# Patient Record
Sex: Male | Born: 1980 | Race: Black or African American | Hispanic: No | Marital: Married | Smoking: Former smoker
Health system: Southern US, Community
[De-identification: ages and names within clinical notes are randomized; demographics above are authoritative.]

---

## 2013-10-02 ENCOUNTER — Ambulatory Visit (INDEPENDENT_AMBULATORY_CARE_PROVIDER_SITE_OTHER): Payer: BC Managed Care – PPO | Admitting: Podiatry

## 2013-10-02 ENCOUNTER — Encounter: Payer: Self-pay | Admitting: Podiatry

## 2013-10-02 ENCOUNTER — Ambulatory Visit (INDEPENDENT_AMBULATORY_CARE_PROVIDER_SITE_OTHER): Payer: BC Managed Care – PPO

## 2013-10-02 VITALS — BP 137/92 | HR 91 | Resp 15 | Ht 71.0 in | Wt 295.0 lb

## 2013-10-02 DIAGNOSIS — M79609 Pain in unspecified limb: Secondary | ICD-10-CM

## 2013-10-02 DIAGNOSIS — M722 Plantar fascial fibromatosis: Secondary | ICD-10-CM

## 2013-10-02 DIAGNOSIS — M79671 Pain in right foot: Secondary | ICD-10-CM

## 2013-10-02 MED ORDER — TRIAMCINOLONE ACETONIDE 10 MG/ML IJ SUSP
10.0000 mg | Freq: Once | INTRAMUSCULAR | Status: AC
  Administered 2013-10-02: 10 mg

## 2013-10-02 MED ORDER — DICLOFENAC SODIUM 75 MG PO TBEC: 75.0000 mg | DELAYED_RELEASE_TABLET | Freq: Two times a day (BID) | ORAL | Status: DC

## 2013-10-02 NOTE — Progress Notes (Signed)
   Subjective:    Patient ID: Charlott RakesMario Juste, male    DOB: 1980-10-30, 33 y.o.   MRN: [id number]  HPI Comments: Pt complains of pain and swelling in the right heel and inferior arch, during basketball playing and after.  Pt states he had 4 episodes over 3 weeks, ER visit prescribed Tylenol #3 for 3 days, soaks and ace wrap the right foot.     Review of Systems  All other systems reviewed and are negative.      Objective:   Physical Exam        Assessment & Plan:

## 2013-10-02 NOTE — Patient Instructions (Signed)

## 2013-10-03 NOTE — Progress Notes (Signed)
Subjective:     Patient ID: Caleb Cook, male   DOB: Sep 27, 1980, 33 y.o.   MRN: [id number]  HPI patient presents with painful right heel of approximate one-month duration. States it's worse with activity and at times it throbs and he has had trouble sleeping and he has not been able to do some of his normal type of athletic events he would like to do   Review of Systems  All other systems reviewed and are negative.      Objective:   Physical Exam  Nursing note and vitals reviewed. Constitutional: He is oriented to person, place, and time.  Cardiovascular: Intact distal pulses.   Musculoskeletal: Normal range of motion.  Neurological: He is oriented to person, place, and time.  Skin: Skin is warm.   neurovascular status intact with muscle strength adequate and range of motion of the subtalar and midtarsal joint within normal limits. Patient's found to have mild equinus condition and digits that are well-perfused and I noted upon palpation the plantar heel is very tender at the insertion of the plantar fascia into the calcaneus     Assessment:     Plantar fasciitis right insertional    Plan:     H&P and x-ray reviewed and today I injected the right plantar fascia 3 mg Kenalog 5 mg I can Marcaine mixture and applied fascially brace to the foot and placed on diclofenac 75 mg twice a day. Reappoint to recheck again in one week

## 2013-10-09 ENCOUNTER — Ambulatory Visit (INDEPENDENT_AMBULATORY_CARE_PROVIDER_SITE_OTHER): Payer: BC Managed Care – PPO | Admitting: Podiatry

## 2013-10-09 ENCOUNTER — Encounter: Payer: Self-pay | Admitting: Podiatry

## 2013-10-09 VITALS — BP 135/91 | HR 101 | Resp 16

## 2013-10-09 DIAGNOSIS — M722 Plantar fascial fibromatosis: Secondary | ICD-10-CM

## 2013-10-09 MED ORDER — TRIAMCINOLONE ACETONIDE 10 MG/ML IJ SUSP
10.0000 mg | Freq: Once | INTRAMUSCULAR | Status: AC
  Administered 2013-10-09: 10 mg

## 2013-10-09 NOTE — Progress Notes (Signed)
Subjective:     Patient ID: Charlott RakesMario Sider, male   DOB: 1980-04-05, 33 y.o.   MRN: [id number]  HPI patient states the right heel is doing pretty well but is painful at the end of the day and he is on his feet all day walking on cement floors   Review of Systems     Objective:   Physical Exam Neurovascular status intact with muscle strength adequate and inflammation pain in the right arch that is painful when pressed    Assessment:     Continue plantar fasciitis right with inflammation with improvement that has occurred    Plan:     Reinjected the plantar fascia 3 mg Kenalog 5 mg Xylocaine and advised on physical therapy and if symptoms were to continue we will consider orthotics therapy

## 2014-05-17 ENCOUNTER — Emergency Department (HOSPITAL_BASED_OUTPATIENT_CLINIC_OR_DEPARTMENT_OTHER)
Admission: EM | Admit: 2014-05-17 | Discharge: 2014-05-17 | Disposition: A | Discharge status: Home / Self Care | Payer: 59 | Attending: Emergency Medicine | Admitting: Emergency Medicine

## 2014-05-17 ENCOUNTER — Encounter (HOSPITAL_BASED_OUTPATIENT_CLINIC_OR_DEPARTMENT_OTHER): Payer: Self-pay

## 2014-05-17 ENCOUNTER — Emergency Department (HOSPITAL_BASED_OUTPATIENT_CLINIC_OR_DEPARTMENT_OTHER): Payer: 59

## 2014-05-17 DIAGNOSIS — X58XXXA Exposure to other specified factors, initial encounter: Secondary | ICD-10-CM | POA: Diagnosis not present

## 2014-05-17 DIAGNOSIS — S8392XA Sprain of unspecified site of left knee, initial encounter: Secondary | ICD-10-CM | POA: Diagnosis not present

## 2014-05-17 DIAGNOSIS — Y998 Other external cause status: Secondary | ICD-10-CM | POA: Insufficient documentation

## 2014-05-17 DIAGNOSIS — S8992XA Unspecified injury of left lower leg, initial encounter: Secondary | ICD-10-CM | POA: Diagnosis present

## 2014-05-17 DIAGNOSIS — Y9367 Activity, basketball: Secondary | ICD-10-CM | POA: Diagnosis not present

## 2014-05-17 DIAGNOSIS — Z87891 Personal history of nicotine dependence: Secondary | ICD-10-CM | POA: Insufficient documentation

## 2014-05-17 DIAGNOSIS — Y92310 Basketball court as the place of occurrence of the external cause: Secondary | ICD-10-CM | POA: Diagnosis not present

## 2014-05-17 MED ORDER — IBUPROFEN 800 MG PO TABS
800.0000 mg | ORAL_TABLET | Freq: Once | ORAL | Status: AC
Start: 2014-05-17 — End: 2014-05-17
  Administered 2014-05-17: 800 mg via ORAL
  Filled 2014-05-17: qty 1

## 2014-05-17 NOTE — ED Notes (Signed)
Patient here with left knee pain after twisting same while playing basketball yesterday, pain with ambulation

## 2014-05-17 NOTE — Discharge Instructions (Signed)
Rest, Ice intermittently (in the first 24-48 hours), Gentle compression with an Ace wrap, and elevate (Limb above the level of the heart)   Take up to 800mg  of ibuprofen (that is usually 4 over the counter pills)  3 times a day for 5 days. Take with food.  Please follow with your primary care doctor in the next 2 days for a check-up. They must obtain records for further management.   Do not hesitate to return to the Emergency Department for any new, worsening or concerning symptoms.    Knee Sprain A knee sprain is a tear in one of the strong, fibrous tissues that connect the bones (ligaments) in your knee. The severity of the sprain depends on how much of the ligament is torn. The tear can be either partial or complete. CAUSES  Often, sprains are a result of a fall or injury. The force of the impact causes the fibers of your ligament to stretch too much. This excess tension causes the fibers of your ligament to tear. SIGNS AND SYMPTOMS  You may have some loss of motion in your knee. Other symptoms include:  Bruising.  Pain in the knee area.  Tenderness of the knee to the touch.  Swelling. DIAGNOSIS  To diagnose a knee sprain, your health care provider will physically examine your knee. Your health care provider may also suggest an X-ray exam of your knee to make sure no bones are broken. TREATMENT  If your ligament is only partially torn, treatment usually involves keeping the knee in a fixed position (immobilization) or bracing your knee for activities that require movement for several weeks. To do this, your health care provider will apply a bandage, cast, or splint to keep your knee from moving and to support your knee during movement until it heals. For a partially torn ligament, the healing process usually takes 4-6 weeks. If your ligament is completely torn, depending on which ligament it is, you may need surgery to reconnect the ligament to the bone or reconstruct it. After surgery,  a cast or splint may be applied and will need to stay on your knee for 4-6 weeks while your ligament heals. HOME CARE INSTRUCTIONS  Keep your injured knee elevated to decrease swelling.  To ease pain and swelling, apply ice to the injured area:  Put ice in a plastic bag.  Place a towel between your skin and the bag.  Leave the ice on for 20 minutes, 2-3 times a day.  Only take medicine for pain as directed by your health care provider.  Do not leave your knee unprotected until pain and stiffness go away (usually 4-6 weeks).  If you have a cast or splint, do not allow it to get wet. If you have been instructed not to remove it, cover it with a plastic bag when you shower or bathe. Do not swim.  Your health care provider may suggest exercises for you to do during your recovery to prevent or limit permanent weakness and stiffness. SEEK IMMEDIATE MEDICAL CARE IF:  Your cast or splint becomes damaged.  Your pain becomes worse.  You have significant pain, swelling, or numbness below the cast or splint. MAKE SURE YOU:  Understand these instructions.  Will watch your condition.  Will get help right away if you are not doing well or get worse. Document Released: 02/06/2005 Document Revised: 11/27/2012 Document Reviewed: 09/18/2012 [facility] Patient Information 2015 [place], [place]. This information is not intended to replace advice given to you by  your health care provider. Make sure you discuss any questions you have with your health care provider. ° °

## 2014-05-17 NOTE — ED Provider Notes (Signed)
CSN: [id number]     Arrival date & time 05/17/14  1052 History   First MD Initiated Contact with Patient 05/17/14 1159     Chief Complaint  Patient presents with  . Knee Injury     (Consider location/radiation/quality/duration/timing/severity/associated sxs/prior Treatment) HPI   Caleb Cook is a 34 y.o. male complaining of moderate 6 out of 10 anterior left knee pain after playing basketball yesterday, he cannot remember the specific trauma or fall that he thinks he landed on the knee along. He's been taking Tylenol at home with little relief. Patient is ambulatory but it is painful with weightbearing. He denies weakness, numbness, paresthesia, he has a history of prior trauma or surgeries to the affected joint. Denies reduced range of motion.  History reviewed. No pertinent past medical history. History reviewed. No pertinent past surgical history. Family History  Problem Relation Age of Onset  . Hypertension Mother   . Hypertension Father    History  Substance Use Topics  . Smoking status: Former Games developer  . Smokeless tobacco: Not on file  . Alcohol Use: Not on file    Review of Systems  10 systems reviewed and found to be negative, except as noted in the HPI.  Allergies  Review of patient's allergies indicates no known allergies.  Home Medications   Prior to Admission medications   Not on File   BP 133/86 mmHg  Pulse 94  Temp(Src) 98.2 F (36.8 C) (Oral)  Resp 20  Ht  (1.803 m)  Wt 305 lb (138.347 kg)  BMI 42.56 kg/m2  SpO2 97% Physical Exam  Constitutional: He is oriented to person, place, and time. He appears well-developed and well-nourished. No distress.  HENT:  Head: Normocephalic.  Eyes: Conjunctivae and EOM are normal.  Cardiovascular: Normal rate.   Pulmonary/Chest: Effort normal. No stridor.  Musculoskeletal: Normal range of motion. He exhibits tenderness.       Legs: Left knee:  No deformity, erythema or abrasions. FROM. No effusion or  crepitance. Anterior and posterior drawer show no abnormal laxity. Stable to valgus and varus stress. Joint lines are non-tender. Pt is diffusely TTP along the anterior knees. Neurovascularly intact. Pt ambulates with antalgic gait.    Neurological: He is alert and oriented to person, place, and time.  Psychiatric: He has a normal mood and affect.  Nursing note and vitals reviewed.   ED Course  Procedures (including critical care time) Labs Review Labs Reviewed - No data to display  Imaging Review Dg Knee Complete 4 Views Left  05/17/2014   CLINICAL DATA:  Twisted left knee while playing basketball 1 day prior. Pain anteriorly. Limited range of motion  EXAM: LEFT KNEE - COMPLETE 4+ VIEW  COMPARISON:  May 16, 2014  FINDINGS: Frontal, lateral, and bilateral oblique views were obtained. There is no demonstrable fracture or dislocation. No appreciable joint effusion joint spaces appear intact. No erosive change.  IMPRESSION: No fracture or dislocation.  No appreciable arthropathy.   Electronically Signed   By: Bretta Bang III M.D.   On: 05/17/2014 11:23     EKG Interpretation None      MDM   Final diagnoses:  Left knee sprain, initial encounter    Filed Vitals:   05/17/14 1058  BP: 133/86  Pulse: 94  Temp: 98.2 F (36.8 C)  TempSrc: Oral  Resp: 20  Height:  (1.803 m)  Weight: 305 lb (138.347 kg)  SpO2: 97%    Medications  ibuprofen (ADVIL,MOTRIN) tablet 800 mg (  800 mg Oral Given 05/17/14 1213)    Caleb RakesMario Cook is a pleasant 34 y.o. male presenting with left knee pain after playing sports. Neurovascularly intact with mild tenderness to palpation. X-rays negative. Will treat with rest, ice, compression elevation, NSAIDS. Patient given crutches and an orthopedic follow-up.   Evaluation does not show pathology that would require ongoing emergent intervention or inpatient treatment. Pt is hemodynamically stable and mentating appropriately. Discussed findings and  plan with patient/guardian, who agrees with care plan. All questions answered. Return precautions discussed and outpatient follow up given.       Caleb Emeryicole Lanell Dubie, PA-C 05/17/14 1216  Jerelyn ScottMartha Linker, MD 05/17/14 [phone]

## 2014-05-17 NOTE — ED Notes (Signed)
States took 2 tylenol tabs PO approx 3 hours ago, w/o relief, pain increases with walking, pain improves when not walking and "resting" lt knee

## 2014-05-18 ENCOUNTER — Ambulatory Visit: Payer: 59 | Admitting: Family Medicine

## 2016-02-15 IMAGING — CR DG KNEE COMPLETE 4+V*L*
4 series · 4 of 4 positions shown · non-contrast
Comparison: May 16, 2014

CLINICAL DATA: Twisted left knee while playing basketball 1 day
prior. Pain anteriorly. Limited range of motion

EXAM:
LEFT KNEE - COMPLETE 4+ VIEW

[t knee ap left]
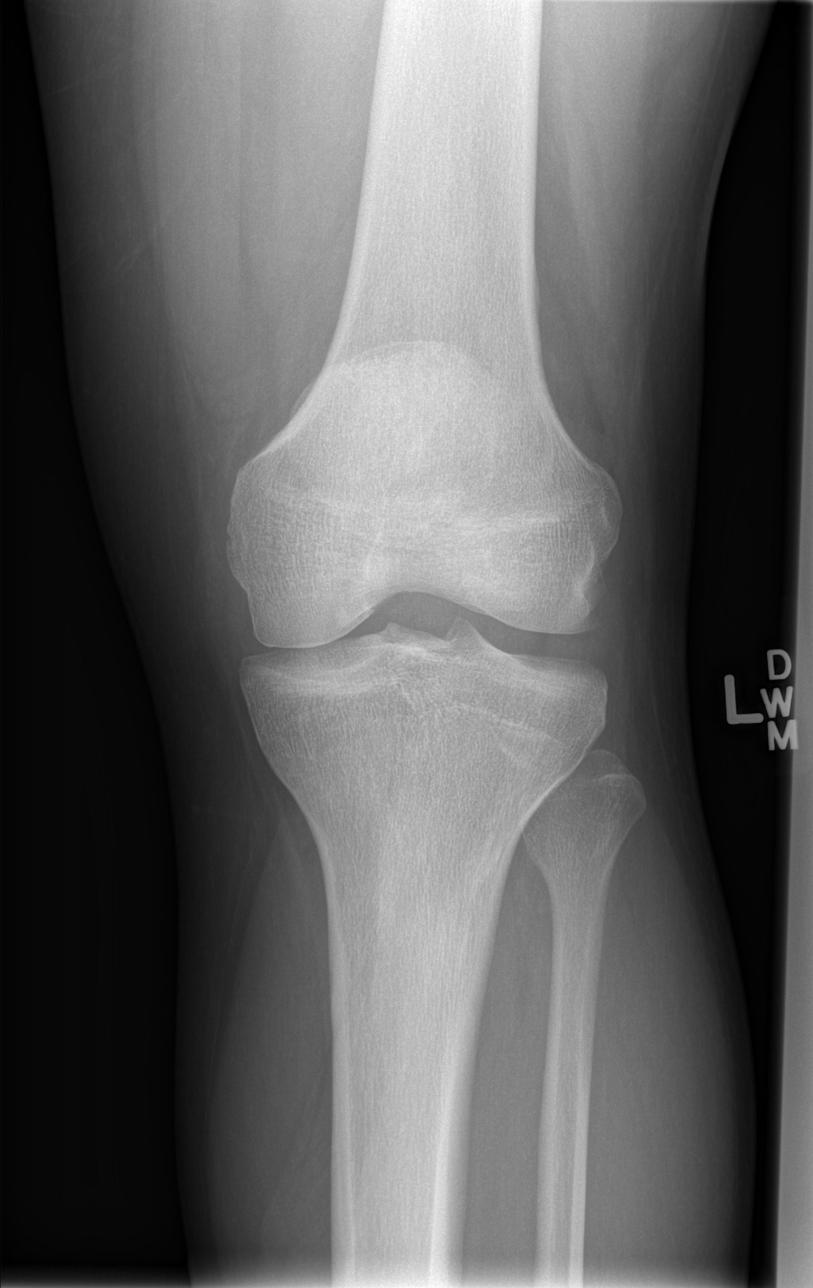

[t knee oblique left (1 of 2)]
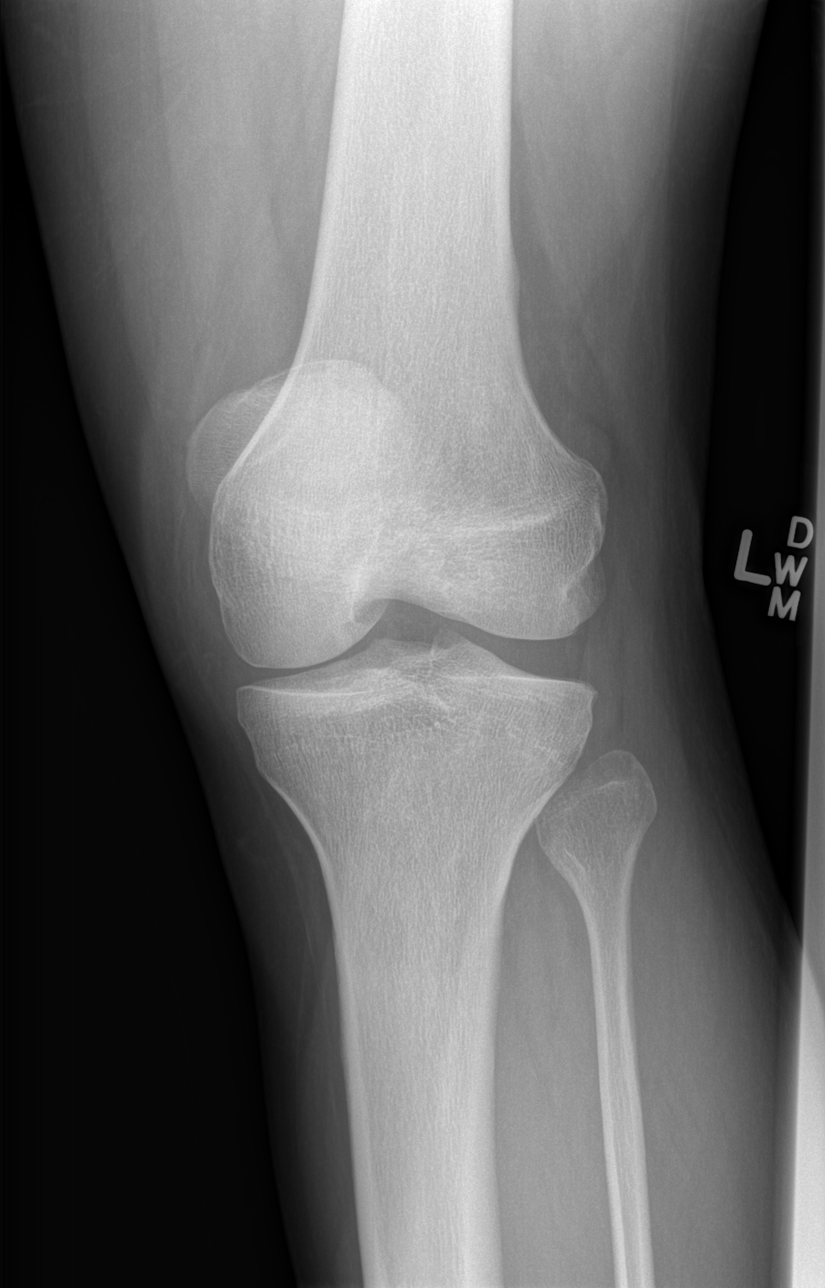

[t knee oblique left (2 of 2)]
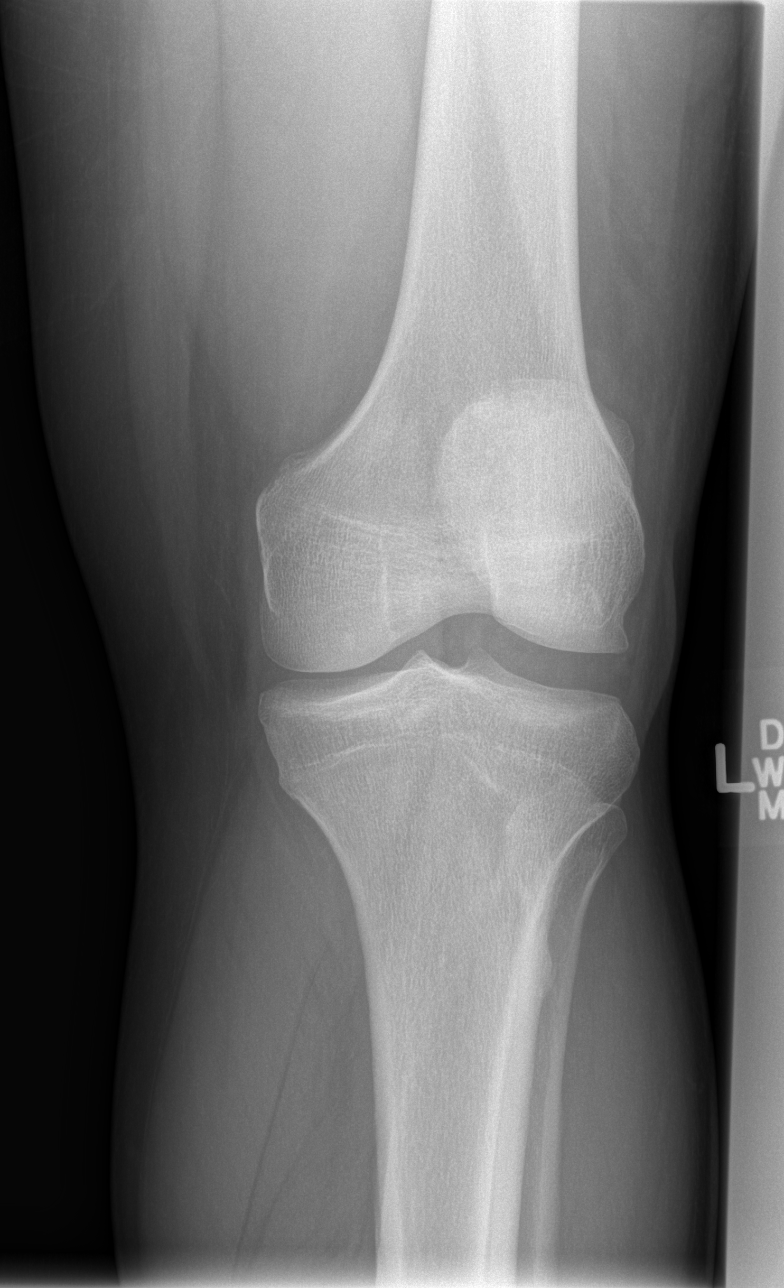

[t knee lat left]
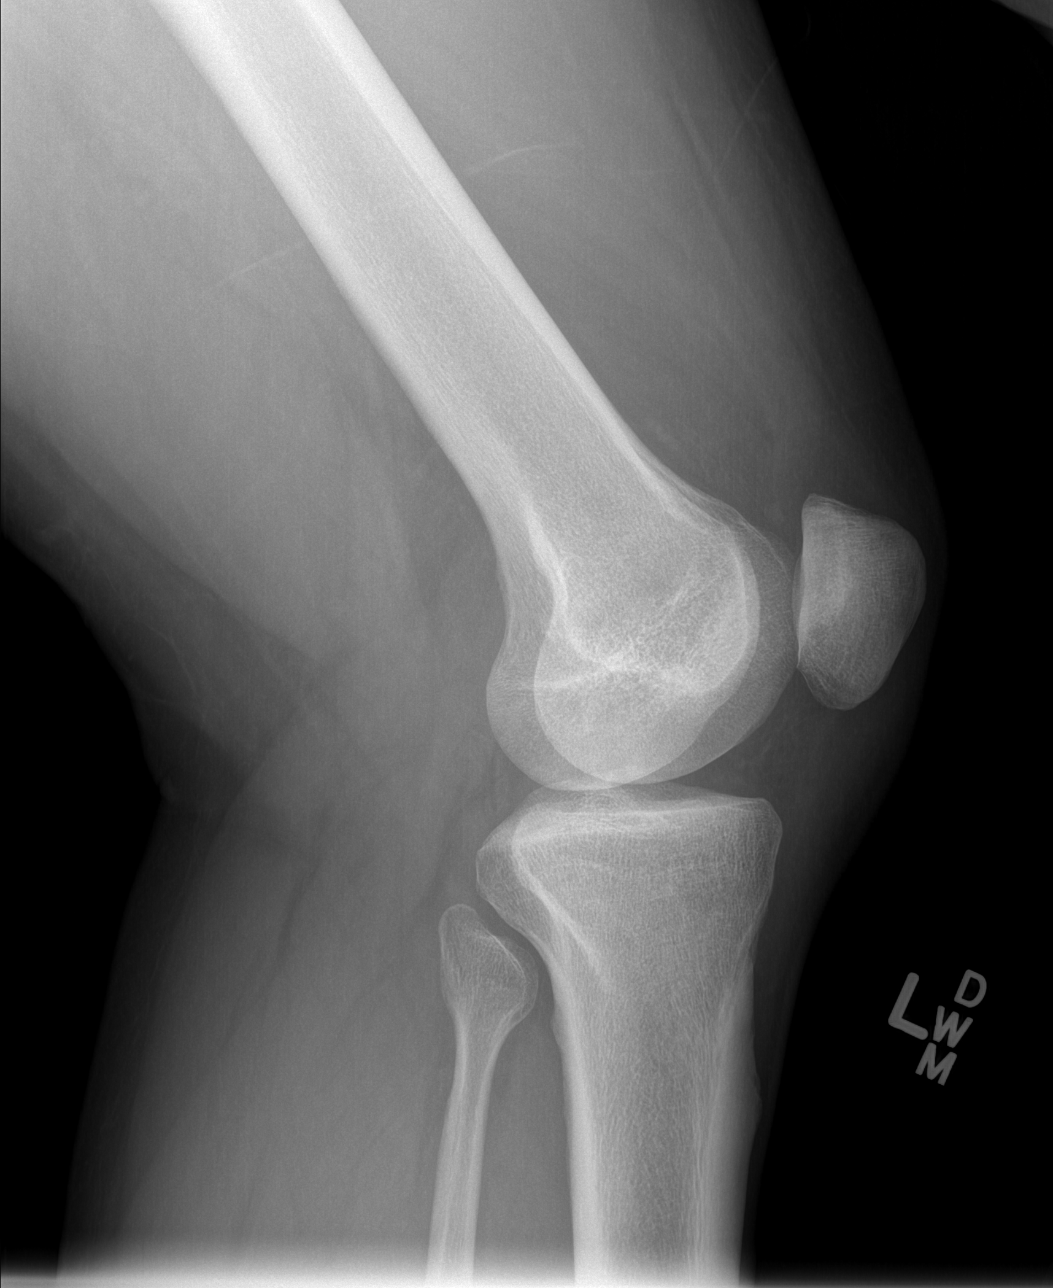

[4 of 4 positions shown; findings below may reference images not displayed]

FINDINGS: Frontal, lateral, and bilateral oblique views were obtained. There
is no demonstrable fracture or dislocation. No appreciable joint
effusion joint spaces appear intact. No erosive change.
IMPRESSION: No fracture or dislocation.  No appreciable arthropathy.
